# Patient Record
Sex: Male | Born: 1965 | Race: White | Hispanic: No | Marital: Married | Smoking: Current every day smoker
Health system: Southern US, Community
[De-identification: ages and names within clinical notes are randomized; demographics above are authoritative.]

## PROBLEM LIST (undated history)

## (undated) DIAGNOSIS — M1990 Unspecified osteoarthritis, unspecified site: Secondary | ICD-10-CM

## (undated) HISTORY — PX: VASECTOMY: SHX75

## (undated) HISTORY — DX: Unspecified osteoarthritis, unspecified site: M19.90

## (undated) HISTORY — PX: APPENDECTOMY: SHX54

---

## 2016-09-22 ENCOUNTER — Other Ambulatory Visit: Payer: Self-pay | Admitting: Emergency Medicine

## 2016-09-22 ENCOUNTER — Ambulatory Visit (INDEPENDENT_AMBULATORY_CARE_PROVIDER_SITE_OTHER): Payer: Self-pay

## 2016-09-22 DIAGNOSIS — Z0000 Encounter for general adult medical examination without abnormal findings: Secondary | ICD-10-CM

## 2016-09-22 DIAGNOSIS — F17200 Nicotine dependence, unspecified, uncomplicated: Secondary | ICD-10-CM

## 2019-07-12 ENCOUNTER — Encounter: Payer: Self-pay | Admitting: Emergency Medicine

## 2019-07-12 ENCOUNTER — Other Ambulatory Visit: Payer: Self-pay

## 2019-07-12 ENCOUNTER — Ambulatory Visit: Payer: 59 | Admitting: Emergency Medicine

## 2019-07-12 ENCOUNTER — Ambulatory Visit (INDEPENDENT_AMBULATORY_CARE_PROVIDER_SITE_OTHER): Payer: 59

## 2019-07-12 VITALS — BP 131/82 | HR 74 | Temp 98.6°F | Wt 225.8 lb

## 2019-07-12 DIAGNOSIS — R109 Unspecified abdominal pain: Secondary | ICD-10-CM | POA: Diagnosis not present

## 2019-07-12 DIAGNOSIS — Z13220 Encounter for screening for lipoid disorders: Secondary | ICD-10-CM | POA: Diagnosis not present

## 2019-07-12 DIAGNOSIS — Z1329 Encounter for screening for other suspected endocrine disorder: Secondary | ICD-10-CM

## 2019-07-12 DIAGNOSIS — M546 Pain in thoracic spine: Secondary | ICD-10-CM

## 2019-07-12 DIAGNOSIS — Z1211 Encounter for screening for malignant neoplasm of colon: Secondary | ICD-10-CM

## 2019-07-12 DIAGNOSIS — Z1321 Encounter for screening for nutritional disorder: Secondary | ICD-10-CM

## 2019-07-12 DIAGNOSIS — Z7689 Persons encountering health services in other specified circumstances: Secondary | ICD-10-CM | POA: Diagnosis not present

## 2019-07-12 DIAGNOSIS — F17200 Nicotine dependence, unspecified, uncomplicated: Secondary | ICD-10-CM | POA: Insufficient documentation

## 2019-07-12 DIAGNOSIS — Z130 Encounter for screening for diseases of the blood and blood-forming organs and certain disorders involving the immune mechanism: Secondary | ICD-10-CM

## 2019-07-12 DIAGNOSIS — Z13228 Encounter for screening for other metabolic disorders: Secondary | ICD-10-CM

## 2019-07-12 LAB — POCT URINALYSIS DIP (MANUAL ENTRY)
Bilirubin, UA: NEGATIVE
Blood, UA: NEGATIVE
Glucose, UA: NEGATIVE mg/dL
Ketones, POC UA: NEGATIVE mg/dL
Leukocytes, UA: NEGATIVE
Nitrite, UA: NEGATIVE
Protein Ur, POC: NEGATIVE mg/dL
Spec Grav, UA: 1.025 (ref 1.010–1.025)
Urobilinogen, UA: 0.2 E.U./dL
pH, UA: 5.5 (ref 5.0–8.0)

## 2019-07-12 NOTE — Progress Notes (Signed)
Ralph Schneider 54 y.o.   Chief Complaint  Patient presents with   Establish Care    here to est care. Patient stated 3 weeks ago he had pain in the right rib cage then right mid back area. Pain is better but still there   Elbow Pain    dx with Tennis elbow 1 year ago still have pain and numbness in right elbow     HISTORY OF PRESENT ILLNESS: This is a 54 y.o. male here to establish care with me, first visit to this office. No history of chronic medical problems.  Father has a history of hypertension and stroke.  He is concerned about these conditions. Chronic smoker.  No history of diabetes.  Not taking any medications at present time. Pretty active physically. Has had pain to right thoracic area for the past 2 weeks but is getting better.  Feels like muscle pain. Also has a history of chronic right tennis elbow pain.  Better today. No other complaints or medical concerns today.  HPI   Prior to Admission medications   Not on File    Not on File  There are no problems to display for this patient.   Past Medical History:  Diagnosis Date   Arthritis     Past Surgical History:  Procedure Laterality Date   APPENDECTOMY     VASECTOMY      Social History   Socioeconomic History   Marital status: Married    Spouse name: Not on file   Number of children: Not on file   Years of education: Not on file   Highest education level: Not on file  Occupational History   Not on file  Tobacco Use   Smoking status: Current Every Day Smoker    Packs/day: 1.50   Smokeless tobacco: Never Used  Substance and Sexual Activity   Alcohol use: Yes   Drug use: Not Currently   Sexual activity: Yes  Other Topics Concern   Not on file  Social History Narrative   Not on file   Social Determinants of Health   Financial Resource Strain:    Difficulty of Paying Living Expenses: Not on file  Food Insecurity:    Worried About Running Out of Food in the Last Year:  Not on file   Ran Out of Food in the Last Year: Not on file  Transportation Needs:    Lack of Transportation (Medical): Not on file   Lack of Transportation (Non-Medical): Not on file  Physical Activity:    Days of Exercise per Week: Not on file   Minutes of Exercise per Session: Not on file  Stress:    Feeling of Stress : Not on file  Social Connections:    Frequency of Communication with Friends and Family: Not on file   Frequency of Social Gatherings with Friends and Family: Not on file   Attends Religious Services: Not on file   Active Member of Clubs or Organizations: Not on file   Attends Banker Meetings: Not on file   Marital Status: Not on file  Intimate Partner Violence:    Fear of Current or Ex-Partner: Not on file   Emotionally Abused: Not on file   Physically Abused: Not on file   Sexually Abused: Not on file    Family History  Problem Relation Age of Onset   Dementia Mother    Heart disease Father    Hyperlipidemia Father    Hyperlipidemia Sister  Review of Systems  Constitutional: Negative.  Negative for chills and fever.  HENT: Negative.  Negative for congestion and sore throat.   Respiratory: Negative.  Negative for cough and shortness of breath.   Cardiovascular: Negative.  Negative for chest pain and palpitations.  Gastrointestinal: Negative.  Negative for abdominal pain, blood in stool, diarrhea, melena, nausea and vomiting.  Genitourinary: Negative.  Negative for dysuria and hematuria.  Musculoskeletal: Positive for back pain and joint pain. Negative for myalgias and neck pain.  Skin: Negative.  Negative for rash.  Neurological: Negative for dizziness and headaches.  All other systems reviewed and are negative.   Today's Vitals   07/12/19 1106  BP: 131/82  Pulse: 74  Temp: 98.6 F (37 C)  TempSrc: Temporal  SpO2: 98%  Weight: 225 lb 12.8 oz (102.4 kg)   There is no height or weight on file to calculate  BMI.  Physical Exam Vitals reviewed.  Constitutional:      Appearance: Normal appearance.  HENT:     Head: Normocephalic.  Eyes:     Extraocular Movements: Extraocular movements intact.     Pupils: Pupils are equal, round, and reactive to light.  Cardiovascular:     Rate and Rhythm: Normal rate and regular rhythm.     Heart sounds: Normal heart sounds.  Pulmonary:     Effort: Pulmonary effort is normal.     Breath sounds: Normal breath sounds.  Abdominal:     Palpations: Abdomen is soft.     Tenderness: There is no abdominal tenderness.  Musculoskeletal:        General: Normal range of motion.     Cervical back: Normal range of motion and neck supple.       Back:     Comments: Right elbow: Full range of motion.  Minimal tenderness to lateral epicondyle otherwise normal.  Skin:    General: Skin is warm and dry.     Capillary Refill: Capillary refill takes less than 2 seconds.  Neurological:     General: No focal deficit present.     Mental Status: He is alert and oriented to person, place, and time.  Psychiatric:        Mood and Affect: Mood normal.        Behavior: Behavior normal.    DG Chest 2 View  Result Date: 07/12/2019 CLINICAL DATA:  Right-sided chest pain EXAM: CHEST - 2 VIEW COMPARISON:  09/22/2016 FINDINGS: The heart size and mediastinal contours are within normal limits. Both lungs are clear. The visualized skeletal structures are unremarkable. IMPRESSION: No active cardiopulmonary disease. Electronically Signed   By: Sharlet Salina M.D.   On: 07/12/2019 12:03   Results for orders placed or performed in visit on 07/12/19 (from the past 24 hour(s))  POCT urinalysis dipstick     Status: None   Collection Time: 07/12/19 11:30 AM  Result Value Ref Range   Color, UA yellow yellow   Clarity, UA clear clear   Glucose, UA negative negative mg/dL   Bilirubin, UA negative negative   Ketones, POC UA negative negative mg/dL   Spec Grav, UA 6.433 2.951 - 1.025   Blood,  UA negative negative   pH, UA 5.5 5.0 - 8.0   Protein Ur, POC negative negative mg/dL   Urobilinogen, UA 0.2 0.2 or 1.0 E.U./dL   Nitrite, UA Negative Negative   Leukocytes, UA Negative Negative     ASSESSMENT & PLAN: Naveed was seen today for establish care and elbow pain.  Diagnoses and all orders for this visit:  Acute right-sided thoracic back pain -     DG Chest 2 View; Future  Encounter to establish care  Flank pain -     POCT urinalysis dipstick  Screening for lipoid disorders -     Lipid Panel  Screening for endocrine, nutritional, metabolic and immunity disorder -     Comprehensive metabolic panel -     Hemoglobin A1c  Screening for deficiency anemia -     CBC with Differential  Colon cancer screening -     Cologuard  Smoker    Patient Instructions       If you have lab work done today you will be contacted with your lab results within the next 2 weeks.  If you have not heard from [place] then please contact us. The fastest way to get your results is to register for My Chart.   IF you received an x-ray today, you will receive an invoice from [facility]. Please contact [facility] at [phone] with questions or concerns regarding your invoice.   IF you received labwork today, you will receive an invoice from [facility]. Please contact [facility] at [phone] with questions or concerns regarding your invoice.   Our billing staff will not be able to assist you with questions regarding bills from these companies.  You will be contacted with the lab results as soon as they are available. The fastest way to get your results is to activate your My Chart account. Instructions are located on the last page of this paperwork. If you have not heard from [place] regarding the results in 2 weeks, please contact this office.      Health Maintenance, Male Adopting a healthy lifestyle and getting preventive care are important in promoting health and  wellness. Ask your health care provider about:  The right schedule for you to have regular tests and exams.  Things you can do on your own to prevent diseases and keep yourself healthy. What should I know about diet, weight, and exercise? Eat a healthy diet   Eat a diet that includes plenty of vegetables, fruits, low-fat dairy products, and lean protein.  Do not eat a lot of foods that are high in solid fats, added sugars, or sodium. Maintain a healthy weight Body mass index (BMI) is a measurement that can be used to identify possible weight problems. It estimates body fat based on height and weight. Your health care provider can help determine your BMI and help you achieve or maintain a healthy weight. Get regular exercise Get regular exercise. This is one of the most important things you can do for your health. Most adults should:  Exercise for at least 150 minutes each week. The exercise should increase your heart rate and make you sweat (moderate-intensity exercise).  Do strengthening exercises at least twice a week. This is in addition to the moderate-intensity exercise.  Spend less time sitting. Even light physical activity can be beneficial. Watch cholesterol and blood lipids Have your blood tested for lipids and cholesterol at 54 years of age, then have this test every 5 years. You may need to have your cholesterol levels checked more often if:  Your lipid or cholesterol levels are high.  You are older than 52 years of age.  You are at high risk for heart disease. What should I know about cancer screening? Many types of cancers can be detected early and may often be prevented. Depending on your health history  and family history, you may need to have cancer screening at various ages. This may include screening for:  Colorectal cancer.  Prostate cancer.  Skin cancer.  Lung cancer. What should I know about heart disease, diabetes, and high blood pressure? Blood pressure  and heart disease  High blood pressure causes heart disease and increases the risk of stroke. This is more likely to develop in people who have high blood pressure readings, are of African descent, or are overweight.  Talk with your health care provider about your target blood pressure readings.  Have your blood pressure checked: ? Every 3-5 years if you are 7-48 years of age. ? Every year if you are 34 years old or older.  If you are between the ages of 69 and 54 and are a current or former smoker, ask your health care provider if you should have a one-time screening for abdominal aortic aneurysm (AAA). Diabetes Have regular diabetes screenings. This checks your fasting blood sugar level. Have the screening done:  Once every three years after age 62 if you are at a normal weight and have a low risk for diabetes.  More often and at a younger age if you are overweight or have a high risk for diabetes. What should I know about preventing infection? Hepatitis B If you have a higher risk for hepatitis B, you should be screened for this virus. Talk with your health care provider to find out if you are at risk for hepatitis B infection. Hepatitis C Blood testing is recommended for:  Everyone born from [birth year] through 1964.  Anyone with known risk factors for hepatitis C. Sexually transmitted infections (STIs)  You should be screened each year for STIs, including gonorrhea and chlamydia, if: ? You are sexually active and are younger than 53 years of age. ? You are older than 53 years of age and your health care provider tells you that you are at risk for this type of infection. ? Your sexual activity has changed since you were last screened, and you are at increased risk for chlamydia or gonorrhea. Ask your health care provider if you are at risk.  Ask your health care provider about whether you are at high risk for HIV. Your health care provider may recommend a prescription medicine to help  prevent HIV infection. If you choose to take medicine to prevent HIV, you should first get tested for HIV. You should then be tested every 3 months for as long as you are taking the medicine. Follow these instructions at home: Lifestyle  Do not use any products that contain nicotine or tobacco, such as cigarettes, e-cigarettes, and chewing tobacco. If you need help quitting, ask your health care provider.  Do not use street drugs.  Do not share needles.  Ask your health care provider for help if you need support or information about quitting drugs. Alcohol use  Do not drink alcohol if your health care provider tells you not to drink.  If you drink alcohol: ? Limit how much you have to 0-2 drinks a day. ? Be aware of how much alcohol is in your drink. In the [place] one drink equals one 12 oz bottle of beer (355 mL), one 5 oz glass of wine (148 mL), or one 1 oz glass of hard liquor (44 mL). General instructions  Schedule regular health, dental, and eye exams.  Stay current with your vaccines.  Tell your health care provider if: ? You often feel depressed. ? You  have ever been abused or do not feel safe at home. Summary  Adopting a healthy lifestyle and getting preventive care are important in promoting health and wellness.  Follow your health care provider's instructions about healthy diet, exercising, and getting tested or screened for diseases.  Follow your health care provider's instructions on monitoring your cholesterol and blood pressure. This information is not intended to replace advice given to you by your health care provider. Make sure you discuss any questions you have with your health care provider. Document Revised: 05/17/2018 Document Reviewed: 05/17/2018 Elsevier Patient Education  2020 Elsevier Inc.      Edwina Barth, MD [facility]

## 2019-07-12 NOTE — Patient Instructions (Addendum)
   If you have lab work done today you will be contacted with your lab results within the next 2 weeks.  If you have not heard from us then please contact us. The fastest way to get your results is to register for My Chart.   IF you received an x-ray today, you will receive an invoice from [facility]. Please contact [facility] at [phone] with questions or concerns regarding your invoice.   IF you received labwork today, you will receive an invoice from [facility]. Please contact [facility] at [phone] with questions or concerns regarding your invoice.   Our billing staff will not be able to assist you with questions regarding bills from these companies.  You will be contacted with the lab results as soon as they are available. The fastest way to get your results is to activate your My Chart account. Instructions are located on the last page of this paperwork. If you have not heard from us regarding the results in 2 weeks, please contact this office.      Health Maintenance, Male Adopting a healthy lifestyle and getting preventive care are important in promoting health and wellness. Ask your health care provider about:  The right schedule for you to have regular tests and exams.  Things you can do on your own to prevent diseases and keep yourself healthy. What should I know about diet, weight, and exercise? Eat a healthy diet   Eat a diet that includes plenty of vegetables, fruits, low-fat dairy products, and lean protein.  Do not eat a lot of foods that are high in solid fats, added sugars, or sodium. Maintain a healthy weight Body mass index (BMI) is a measurement that can be used to identify possible weight problems. It estimates body fat based on height and weight. Your health care provider can help determine your BMI and help you achieve or maintain a healthy weight. Get regular exercise Get regular exercise. This is one of the most important things you  can do for your health. Most adults should:  Exercise for at least 150 minutes each week. The exercise should increase your heart rate and make you sweat (moderate-intensity exercise).  Do strengthening exercises at least twice a week. This is in addition to the moderate-intensity exercise.  Spend less time sitting. Even light physical activity can be beneficial. Watch cholesterol and blood lipids Have your blood tested for lipids and cholesterol at 54 years of age, then have this test every 5 years. You may need to have your cholesterol levels checked more often if:  Your lipid or cholesterol levels are high.  You are older than 53 years of age.  You are at high risk for heart disease. What should I know about cancer screening? Many types of cancers can be detected early and may often be prevented. Depending on your health history and family history, you may need to have cancer screening at various ages. This may include screening for:  Colorectal cancer.  Prostate cancer.  Skin cancer.  Lung cancer. What should I know about heart disease, diabetes, and high blood pressure? Blood pressure and heart disease  High blood pressure causes heart disease and increases the risk of stroke. This is more likely to develop in people who have high blood pressure readings, are of African descent, or are overweight.  Talk with your health care provider about your target blood pressure readings.  Have your blood pressure checked: ? Every 3-5 years if you are 18-39   years of age. ? Every year if you are 40 years old or older.  If you are between the ages of 65 and 75 and are a current or former smoker, ask your health care provider if you should have a one-time screening for abdominal aortic aneurysm (AAA). Diabetes Have regular diabetes screenings. This checks your fasting blood sugar level. Have the screening done:  Once every three years after age 45 if you are at a normal weight and have  a low risk for diabetes.  More often and at a younger age if you are overweight or have a high risk for diabetes. What should I know about preventing infection? Hepatitis B If you have a higher risk for hepatitis B, you should be screened for this virus. Talk with your health care provider to find out if you are at risk for hepatitis B infection. Hepatitis C Blood testing is recommended for:  Everyone born from 1945 through 1965.  Anyone with known risk factors for hepatitis C. Sexually transmitted infections (STIs)  You should be screened each year for STIs, including gonorrhea and chlamydia, if: ? You are sexually active and are younger than 53 years of age. ? You are older than 53 years of age and your health care provider tells you that you are at risk for this type of infection. ? Your sexual activity has changed since you were last screened, and you are at increased risk for chlamydia or gonorrhea. Ask your health care provider if you are at risk.  Ask your health care provider about whether you are at high risk for HIV. Your health care provider may recommend a prescription medicine to help prevent HIV infection. If you choose to take medicine to prevent HIV, you should first get tested for HIV. You should then be tested every 3 months for as long as you are taking the medicine. Follow these instructions at home: Lifestyle  Do not use any products that contain nicotine or tobacco, such as cigarettes, e-cigarettes, and chewing tobacco. If you need help quitting, ask your health care provider.  Do not use street drugs.  Do not share needles.  Ask your health care provider for help if you need support or information about quitting drugs. Alcohol use  Do not drink alcohol if your health care provider tells you not to drink.  If you drink alcohol: ? Limit how much you have to 0-2 drinks a day. ? Be aware of how much alcohol is in your drink. In the [place] one drink equals one 12  oz bottle of beer (355 mL), one 5 oz glass of wine (148 mL), or one 1 oz glass of hard liquor (44 mL). General instructions  Schedule regular health, dental, and eye exams.  Stay current with your vaccines.  Tell your health care provider if: ? You often feel depressed. ? You have ever been abused or do not feel safe at home. Summary  Adopting a healthy lifestyle and getting preventive care are important in promoting health and wellness.  Follow your health care provider's instructions about healthy diet, exercising, and getting tested or screened for diseases.  Follow your health care provider's instructions on monitoring your cholesterol and blood pressure. This information is not intended to replace advice given to you by your health care provider. Make sure you discuss any questions you have with your health care provider. Document Revised: 05/17/2018 Document Reviewed: 05/17/2018 Elsevier Patient Education  2020 Elsevier Inc.  

## 2019-07-13 LAB — CBC WITH DIFFERENTIAL/PLATELET
Basophils Absolute: 0 10*3/uL (ref 0.0–0.2)
Basos: 1 %
EOS (ABSOLUTE): 0.1 10*3/uL (ref 0.0–0.4)
Eos: 2 %
Hematocrit: 41.2 % (ref 37.5–51.0)
Hemoglobin: 14.9 g/dL (ref 13.0–17.7)
Immature Grans (Abs): 0 10*3/uL (ref 0.0–0.1)
Immature Granulocytes: 0 %
Lymphocytes Absolute: 2.2 10*3/uL (ref 0.7–3.1)
Lymphs: 35 %
MCH: 31.9 pg (ref 26.6–33.0)
MCHC: 36.2 g/dL — ABNORMAL HIGH (ref 31.5–35.7)
MCV: 88 fL (ref 79–97)
Monocytes Absolute: 0.5 10*3/uL (ref 0.1–0.9)
Monocytes: 7 %
Neutrophils Absolute: 3.5 10*3/uL (ref 1.4–7.0)
Neutrophils: 55 %
Platelets: 176 10*3/uL (ref 150–450)
RBC: 4.67 x10E6/uL (ref 4.14–5.80)
RDW: 11.8 % (ref 11.6–15.4)
WBC: 6.4 10*3/uL (ref 3.4–10.8)

## 2019-07-13 LAB — COMPREHENSIVE METABOLIC PANEL
ALT: 47 IU/L — ABNORMAL HIGH (ref 0–44)
AST: 30 IU/L (ref 0–40)
Albumin/Globulin Ratio: 2.6 — ABNORMAL HIGH (ref 1.2–2.2)
Albumin: 4.9 g/dL (ref 3.8–4.9)
Alkaline Phosphatase: 104 IU/L (ref 39–117)
BUN/Creatinine Ratio: 12 (ref 9–20)
BUN: 12 mg/dL (ref 6–24)
Bilirubin Total: 0.3 mg/dL (ref 0.0–1.2)
CO2: 19 mmol/L — ABNORMAL LOW (ref 20–29)
Calcium: 9.7 mg/dL (ref 8.7–10.2)
Chloride: 105 mmol/L (ref 96–106)
Creatinine, Ser: 0.99 mg/dL (ref 0.76–1.27)
GFR calc Af Amer: 101 mL/min/{1.73_m2} (ref >59)
GFR calc non Af Amer: 87 mL/min/{1.73_m2} (ref >59)
Globulin, Total: 1.9 g/dL (ref 1.5–4.5)
Glucose: 84 mg/dL (ref 65–99)
Potassium: 4.3 mmol/L (ref 3.5–5.2)
Sodium: 140 mmol/L (ref 134–144)
Total Protein: 6.8 g/dL (ref 6.0–8.5)

## 2019-07-13 LAB — LIPID PANEL
Chol/HDL Ratio: 7 ratio — ABNORMAL HIGH (ref 0.0–5.0)
Cholesterol, Total: 253 mg/dL — ABNORMAL HIGH (ref 100–199)
HDL: 36 mg/dL — ABNORMAL LOW (ref >39)
LDL Chol Calc (NIH): 166 mg/dL — ABNORMAL HIGH (ref 0–99)
Triglycerides: 272 mg/dL — ABNORMAL HIGH (ref 0–149)
VLDL Cholesterol Cal: 51 mg/dL — ABNORMAL HIGH (ref 5–40)

## 2019-07-13 LAB — HEMOGLOBIN A1C
Est. average glucose Bld gHb Est-mCnc: 111 mg/dL
Hgb A1c MFr Bld: 5.5 % (ref 4.8–5.6)

## 2019-07-14 ENCOUNTER — Other Ambulatory Visit: Payer: Self-pay | Admitting: Emergency Medicine

## 2019-07-14 DIAGNOSIS — E785 Hyperlipidemia, unspecified: Secondary | ICD-10-CM

## 2019-07-14 MED ORDER — ROSUVASTATIN CALCIUM 10 MG PO TABS: 10.0000 mg | ORAL_TABLET | Freq: Every day | ORAL | 3 refills | Status: AC

## 2020-07-14 ENCOUNTER — Encounter: Payer: 59 | Admitting: Emergency Medicine

## 2021-07-17 IMAGING — DX DG CHEST 2V
2 series · 2 of 2 positions shown · non-contrast
Comparison: 09/22/2016

CLINICAL DATA: Right-sided chest pain

EXAM:
CHEST - 2 VIEW

[chest pa]
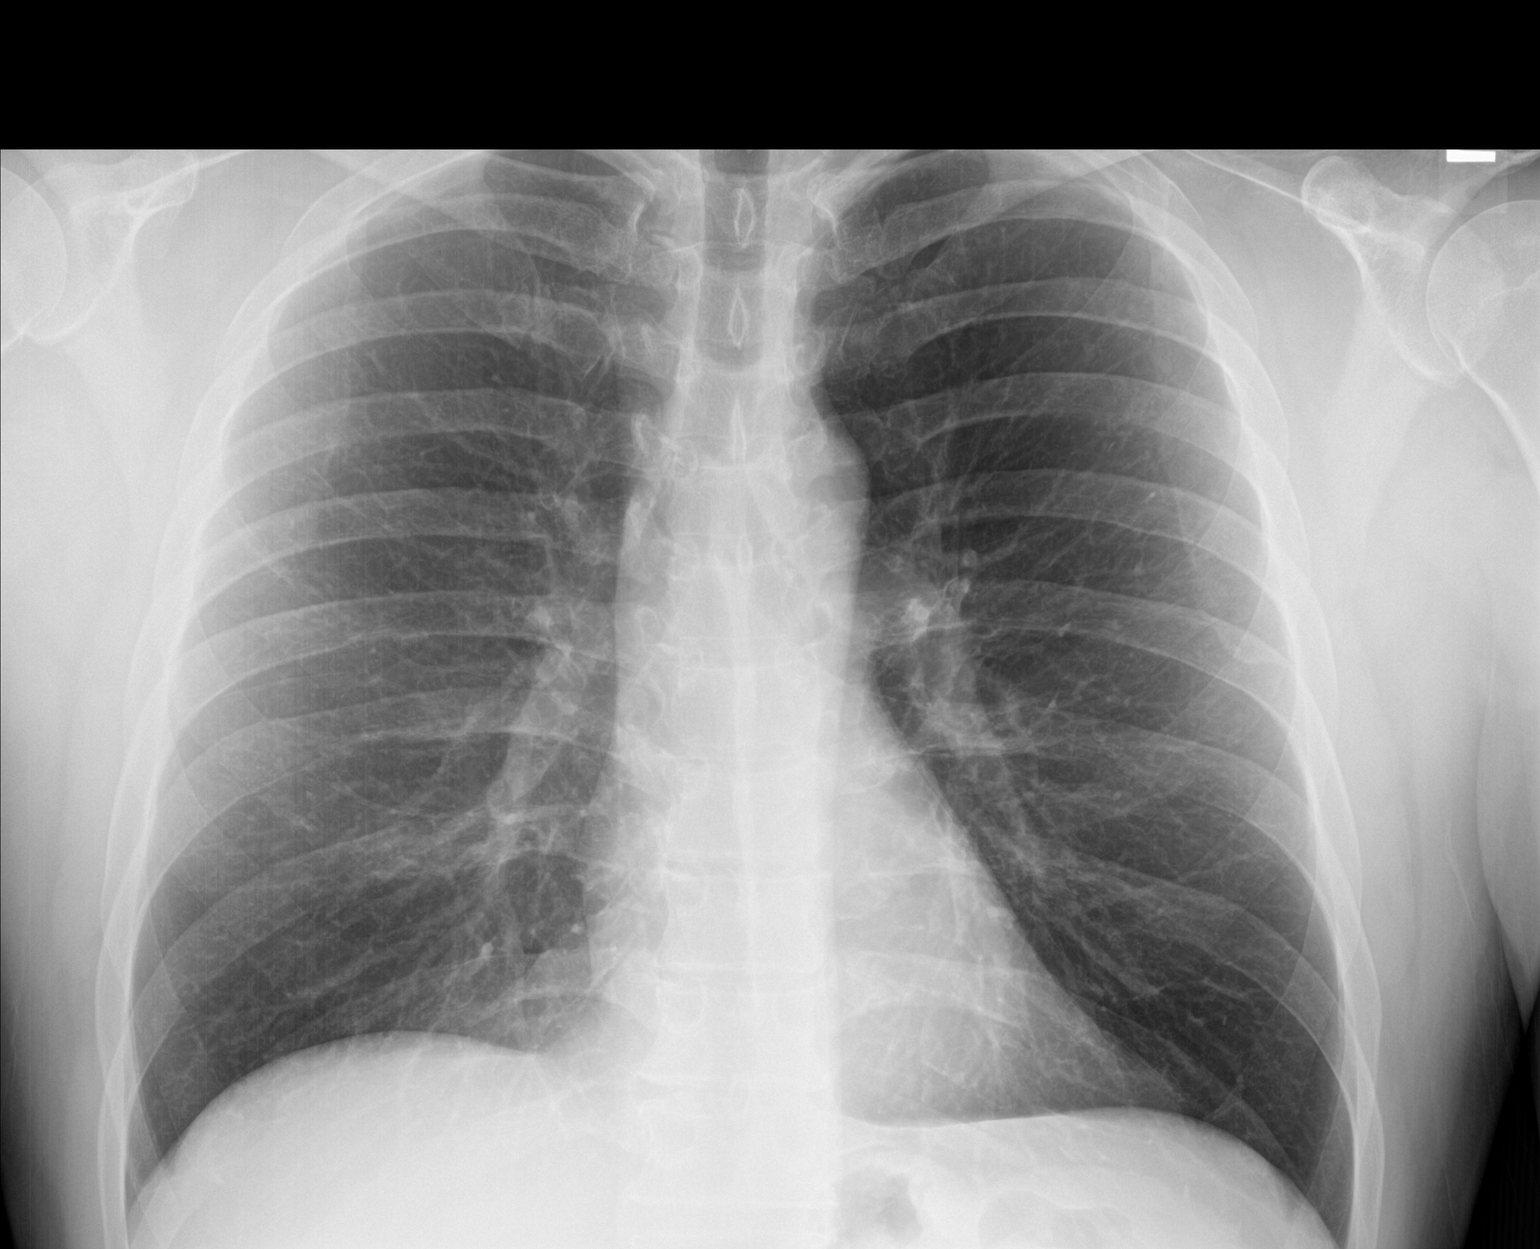

[chest lat]
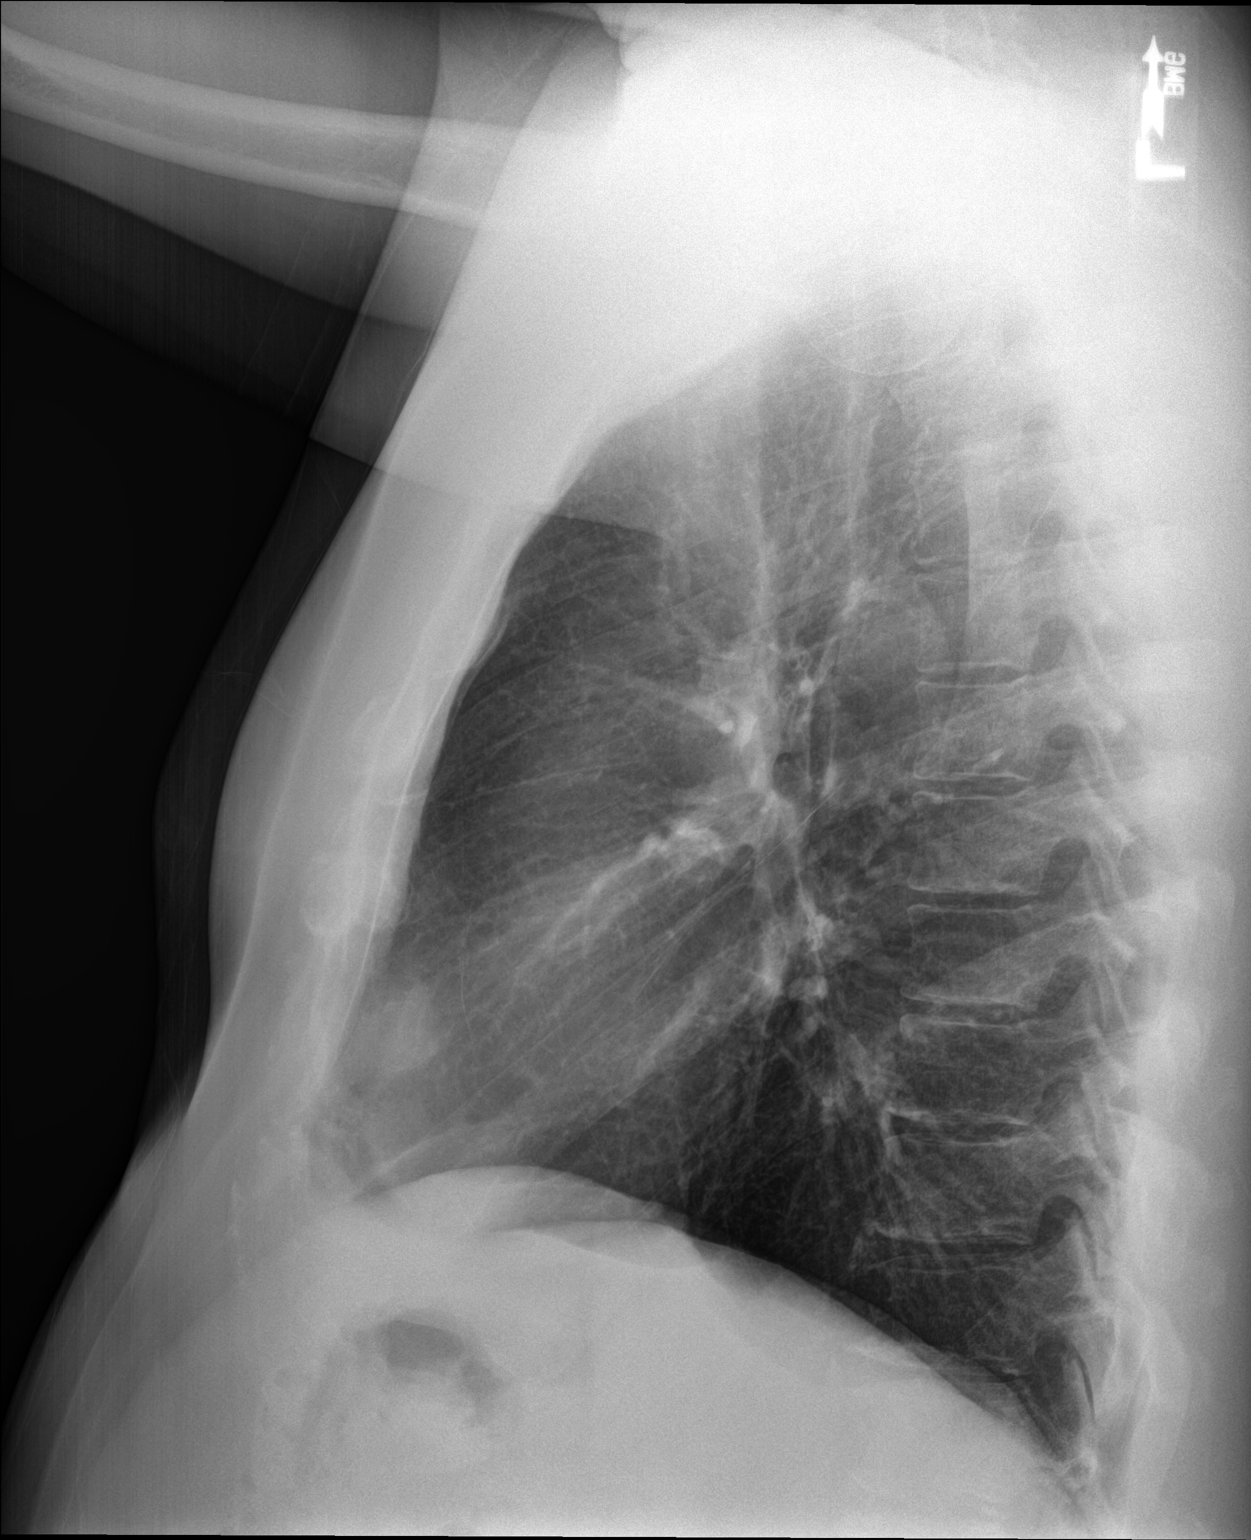

[2 of 2 positions shown; findings below may reference images not displayed]

FINDINGS: The heart size and mediastinal contours are within normal limits.
Both lungs are clear. The visualized skeletal structures are
unremarkable.
IMPRESSION: No active cardiopulmonary disease.
